# Patient Record
Sex: Male | Born: 1960 | Race: Asian | Hispanic: No | Marital: Single | Smoking: Never smoker
Health system: Southern US, Community
[De-identification: ages and names within clinical notes are randomized; demographics above are authoritative.]

## PROBLEM LIST (undated history)

## (undated) DIAGNOSIS — M109 Gout, unspecified: Secondary | ICD-10-CM

---

## 2011-08-14 ENCOUNTER — Ambulatory Visit (INDEPENDENT_AMBULATORY_CARE_PROVIDER_SITE_OTHER): Payer: BC Managed Care – PPO | Admitting: Family Medicine

## 2011-08-14 ENCOUNTER — Encounter: Payer: Self-pay | Admitting: Family Medicine

## 2011-08-14 VITALS — BP 132/86 | HR 57 | Temp 98.7°F | Ht 65.25 in | Wt 155.0 lb

## 2011-08-14 DIAGNOSIS — Z7189 Other specified counseling: Secondary | ICD-10-CM

## 2011-08-14 DIAGNOSIS — Z719 Counseling, unspecified: Secondary | ICD-10-CM

## 2011-08-14 DIAGNOSIS — H5310 Unspecified subjective visual disturbances: Secondary | ICD-10-CM

## 2011-08-14 LAB — CBC
MCH: 25.2 pg — ABNORMAL LOW (ref 26.0–34.0)
MCHC: 32.7 g/dL (ref 30.0–36.0)
MCV: 76.9 fL — ABNORMAL LOW (ref 78.0–100.0)
Platelets: 204 10*3/uL (ref 150–400)
RDW: 14.1 % (ref 11.5–15.5)
WBC: 8.3 10*3/uL (ref 4.0–10.5)

## 2011-08-14 LAB — LIPID PANEL
Cholesterol: 182 mg/dL (ref 0–200)
HDL: 33 mg/dL — ABNORMAL LOW (ref >39)
Total CHOL/HDL Ratio: 5.5 Ratio
Triglycerides: 98 mg/dL (ref <150)
VLDL: 20 mg/dL (ref 0–40)

## 2011-08-14 LAB — COMPREHENSIVE METABOLIC PANEL
ALT: 40 U/L (ref 0–53)
CO2: 29 mEq/L (ref 19–32)
Creat: 1 mg/dL (ref 0.50–1.35)
Total Bilirubin: 0.5 mg/dL (ref 0.3–1.2)

## 2011-08-14 NOTE — Progress Notes (Signed)
  Subjective:    Patient ID: Steve Sharp, male    DOB: 03-10-1961, 50 y.o.   MRN: [id number]  HPI Patient is here for a new patient exam/yearly physical:  Health maintenance: Patient refuses flu vaccine. Patient would like to defer colonoscopy at this time. States he will talk to his wife. Although patient does not have any past medical history or strong family history of medical problem he does request to have low work drawn today.  Left eye vision change: Patient states for the past 2-3 months he will have cloudy vision off and on. This occurs daily. If he rubs his eyes his vision clears. His vision was a cloudy until he rubs his left eye. His last eye and was 8-7 years ago. Patient states he can still see well. Does not use glasses.  See the appropriate areas of chart for past medical history, surgeries, social history and family history.   Review of Systems No problems with urination. No black stools. No bright red blood per rectum. No cold symptoms. No fever. No swelling in ankles. No weakness. No chest pain.    Objective:   Physical Exam  Constitutional: He is oriented to person, place, and time. He appears well-developed and well-nourished.  HENT:  Head: Normocephalic and atraumatic.  Right Ear: External ear normal.  Left Ear: External ear normal.  Mouth/Throat: Oropharynx is clear and moist.  Eyes: EOM are normal. Pupils are equal, round, and reactive to light. Right eye exhibits no discharge and no exudate. No foreign body present in the right eye. Left eye exhibits no discharge and no exudate. No foreign body present in the left eye. Right conjunctiva is not injected. No scleral icterus.       No pain in eye. No pain with movement of eye muscles. No redness of eye. Funduscopic exam within normal limits. Retinal reflex equal bilateral.  Neck: Normal range of motion. No thyromegaly present.  Cardiovascular: Normal rate, regular rhythm and normal heart sounds.   No murmur  heard. Pulmonary/Chest: Effort normal and breath sounds normal. No respiratory distress. He has no wheezes.  Abdominal: Soft. He exhibits no distension. There is no tenderness.  Musculoskeletal: Normal range of motion. He exhibits no edema.  Neurological: He is alert and oriented to person, place, and time.  Skin: No rash noted.  Psychiatric: He has a normal mood and affect. His behavior is normal.          Assessment & Plan:

## 2011-08-17 DIAGNOSIS — H5310 Unspecified subjective visual disturbances: Secondary | ICD-10-CM | POA: Insufficient documentation

## 2011-08-17 DIAGNOSIS — Z719 Counseling, unspecified: Secondary | ICD-10-CM | POA: Insufficient documentation

## 2011-08-17 NOTE — Assessment & Plan Note (Signed)
Vision 20/20 in right eye and 20/60 in left. Would recommend the patient have this examined as soon as possible by an optometrist or ophthalmologist. This has come on slowly over the past 3 months. No eye pain. No findings on physical exam. Retinal exam within normal limits. Do not feel this is an emergency, but do think is the to be examined further by a specialist.

## 2011-08-17 NOTE — Assessment & Plan Note (Signed)
This is patient's first appointment at the clinic. We'll draw a cmet, lipid panel and CBC. Patient states he does not want a flu shot. Patient also states he does not want a colonoscopy at this time. But he will talk with his wife.

## 2011-09-04 ENCOUNTER — Telehealth: Payer: Self-pay | Admitting: Family Medicine

## 2011-09-04 NOTE — Telephone Encounter (Signed)
Fwd. To Dr.Caviness .Joss Friedel  

## 2011-09-04 NOTE — Telephone Encounter (Signed)
Pt seen beginning of the month, had blood work done & never heard back about the results, is asking for a letter and copy of results to be sent to the mailing address.

## 2011-09-06 ENCOUNTER — Encounter: Payer: Self-pay | Admitting: Family Medicine

## 2011-09-06 NOTE — Telephone Encounter (Signed)
Letter sent to patient with results.

## 2011-09-08 ENCOUNTER — Telehealth: Payer: Self-pay | Admitting: Family Medicine

## 2011-09-08 NOTE — Telephone Encounter (Signed)
Pt had questions about her last labs. Explained to pt. Lorenda Hatchet, Renato Battles

## 2011-09-08 NOTE — Telephone Encounter (Signed)
Steve Sharp  Is calling with some questions about her last appointment.

## 2012-03-06 ENCOUNTER — Ambulatory Visit: Payer: BC Managed Care – PPO | Admitting: Family Medicine

## 2012-03-06 ENCOUNTER — Ambulatory Visit (INDEPENDENT_AMBULATORY_CARE_PROVIDER_SITE_OTHER): Payer: BC Managed Care – PPO | Admitting: Sports Medicine

## 2012-03-06 ENCOUNTER — Encounter: Payer: Self-pay | Admitting: Sports Medicine

## 2012-03-06 VITALS — BP 121/77 | HR 73 | Temp 99.0°F | Ht 65.25 in | Wt 153.0 lb

## 2012-03-06 DIAGNOSIS — M7750 Other enthesopathy of unspecified foot: Secondary | ICD-10-CM

## 2012-03-06 DIAGNOSIS — M7742 Metatarsalgia, left foot: Secondary | ICD-10-CM

## 2012-03-06 MED ORDER — IBUPROFEN 800 MG PO TABS: 800.0000 mg | ORAL_TABLET | Freq: Three times a day (TID) | ORAL | Status: AC | PRN

## 2012-03-06 NOTE — Patient Instructions (Addendum)
I would like you to get an X-ray of your foot to make sure you have not broken it - please go to the [facility] radiology department for your X-rays. Please wear the post-op shoe we provided you for the next 2 weeks until your follow up. If you have broken a bone I will call you with further follow up instructions. I have written an prescription for ibuprofen to be taken three times daily for the next 2 weeks to help with inflammation. Please use ice on the affected area 2-3 times per day until the swelling goes down or your pain is gone.   Follow up in 2-3 weeks with myself or your primary doctor.

## 2012-03-12 DIAGNOSIS — M7742 Metatarsalgia, left foot: Secondary | ICD-10-CM | POA: Insufficient documentation

## 2012-03-12 NOTE — Assessment & Plan Note (Signed)
New-onset of left-sided foot pain over the second and third metatarsal heads.  No notable sitting trauma.  No evidence of infection.  Tender to palpation over the second and third heads indicative of likely metatarsalgia.  Question stress fracture in this region given history of long walk on the beach. Patient provided postop shoe and directed to [facility] radiology department for imaging.  Patient voices understanding. Ibuprofen for pain and inflammation

## 2012-03-12 NOTE — Progress Notes (Signed)
Patient ID: Steve Sharp, male   DOB: 08-30-1961, 51 y.o.   MRN: [id number] HPI:  Steve Sharp is a 51 y.o. male presenting today for evaluation of L foot pain reports the pain began 2 days previously.  Does not recall any type of injury.  He does report that 3 days prior to presentation he was at the beach and did a long walk on the beach barefoot.  Denies any prior trauma or injury to this foot.  Denies any fevers chills.  Does report some local erythema and swelling no area of fluctuance pain with motion of the foot pain and with weightbearing    ROS Per history of present illness   HISTORY Medications Reviewed & Updated, see associated section Medical Hx Reviewed: Significant for visual disturbance, no history of diabetes or other signs of immune compromise.  No history of vascular disease  Social History Reviewed:  Significant for nonsmoker will   PE: GENERAL:   adult  male.  Examined in [facility].   In no discomfort; norespiratory distress.   PSYCH: Alert and appropriately interactive; Insight:Good   H&N: AT/[facility], MMM, no scleral icterus, EOMi EXTREMITIES: Moves all 4 extremities spontaneously, warm well perfused, no edema, bilateral DP and PT pulses 2/4.   >MSK/Osteopathic: Mild warmth and mild erythema and edema over the second and third MTP joint. no signs of fluctuance.  Tenderness To palpation over the head of the second and third MTP joint.  No pain with range of motion of the ankle.  Negative drawer test no tenderness to palpation over the talar dome.  No tenderness to palpation on the plantar surface of the second and third metatarsal heads tenderness. ROM of foot and ankle WNL

## 2016-06-29 ENCOUNTER — Emergency Department (HOSPITAL_COMMUNITY)
Admission: EM | Admit: 2016-06-29 | Discharge: 2016-06-29 | Disposition: A | Discharge status: Home / Self Care | Payer: BLUE CROSS/BLUE SHIELD | Attending: Emergency Medicine | Admitting: Emergency Medicine

## 2016-06-29 ENCOUNTER — Emergency Department (HOSPITAL_COMMUNITY): Payer: BLUE CROSS/BLUE SHIELD

## 2016-06-29 ENCOUNTER — Encounter (HOSPITAL_COMMUNITY): Payer: Self-pay | Admitting: Emergency Medicine

## 2016-06-29 DIAGNOSIS — R509 Fever, unspecified: Secondary | ICD-10-CM | POA: Diagnosis present

## 2016-06-29 DIAGNOSIS — J189 Pneumonia, unspecified organism: Secondary | ICD-10-CM | POA: Diagnosis not present

## 2016-06-29 DIAGNOSIS — M10072 Idiopathic gout, left ankle and foot: Secondary | ICD-10-CM | POA: Diagnosis not present

## 2016-06-29 HISTORY — DX: Gout, unspecified: M10.9

## 2016-06-29 LAB — COMPREHENSIVE METABOLIC PANEL
ALK PHOS: 79 U/L (ref 38–126)
ALT: 34 U/L (ref 17–63)
AST: 30 U/L (ref 15–41)
Albumin: 3.7 g/dL (ref 3.5–5.0)
Anion gap: 8 (ref 5–15)
BILIRUBIN TOTAL: 0.8 mg/dL (ref 0.3–1.2)
BUN: 13 mg/dL (ref 6–20)
CALCIUM: 9.3 mg/dL (ref 8.9–10.3)
CHLORIDE: 107 mmol/L (ref 101–111)
CO2: 27 mmol/L (ref 22–32)
CREATININE: 1.22 mg/dL (ref 0.61–1.24)
Glucose, Bld: 120 mg/dL — ABNORMAL HIGH (ref 65–99)
Potassium: 4.4 mmol/L (ref 3.5–5.1)
Sodium: 142 mmol/L (ref 135–145)
TOTAL PROTEIN: 7.9 g/dL (ref 6.5–8.1)

## 2016-06-29 LAB — URINALYSIS, ROUTINE W REFLEX MICROSCOPIC
BILIRUBIN URINE: NEGATIVE
Glucose, UA: NEGATIVE mg/dL
HGB URINE DIPSTICK: NEGATIVE
KETONES UR: NEGATIVE mg/dL
Leukocytes, UA: NEGATIVE
NITRITE: NEGATIVE
Protein, ur: NEGATIVE mg/dL
SPECIFIC GRAVITY, URINE: 1.021 (ref 1.005–1.030)
pH: 5 (ref 5.0–8.0)

## 2016-06-29 LAB — I-STAT CG4 LACTIC ACID, ED
LACTIC ACID, VENOUS: 2.82 mmol/L — AB (ref 0.5–1.9)
LACTIC ACID, VENOUS: 1.19 mmol/L (ref 0.5–1.9)

## 2016-06-29 LAB — CBC WITH DIFFERENTIAL/PLATELET
BASOS PCT: 0 %
Basophils Absolute: 0 10*3/uL (ref 0.0–0.1)
EOS PCT: 1 %
Eosinophils Absolute: 0.2 10*3/uL (ref 0.0–0.7)
HCT: 45.6 % (ref 39.0–52.0)
Hemoglobin: 14.8 g/dL (ref 13.0–17.0)
Lymphocytes Relative: 17 %
Lymphs Abs: 2.4 10*3/uL (ref 0.7–4.0)
MCH: 25.6 pg — ABNORMAL LOW (ref 26.0–34.0)
MCHC: 32.5 g/dL (ref 30.0–36.0)
MCV: 78.8 fL (ref 78.0–100.0)
MONO ABS: 0.8 10*3/uL (ref 0.1–1.0)
MONOS PCT: 6 %
NEUTROS ABS: 10.7 10*3/uL — AB (ref 1.7–7.7)
Neutrophils Relative %: 76 %
Platelets: 263 10*3/uL (ref 150–400)
RBC: 5.79 MIL/uL (ref 4.22–5.81)
RDW: 13.7 % (ref 11.5–15.5)
WBC: 14 10*3/uL — ABNORMAL HIGH (ref 4.0–10.5)

## 2016-06-29 MED ORDER — AZITHROMYCIN 250 MG PO TABS
500.0000 mg | ORAL_TABLET | Freq: Once | ORAL | Status: AC
  Administered 2016-06-29: 500 mg via ORAL
  Filled 2016-06-29: qty 2

## 2016-06-29 MED ORDER — AZITHROMYCIN 250 MG PO TABS: 250.0000 mg | ORAL_TABLET | Freq: Every day | ORAL | 0 refills | Status: DC

## 2016-06-29 MED ORDER — DEXTROSE 5 % IV SOLN
1.0000 g | Freq: Once | INTRAVENOUS | Status: AC
  Administered 2016-06-29: 1 g via INTRAVENOUS
  Filled 2016-06-29: qty 10

## 2016-06-29 NOTE — ED Triage Notes (Signed)
Pt states started running a fever this morning-- has gout in left foot-- states "had a hard time breathing" --  Took Tylenol 2 tabs 0630 prior to coming to the hospital.

## 2016-06-29 NOTE — Discharge Planning (Addendum)
Jupiter Kabir J. Lucretia RoersWood, RN, BSN, [place] [phone] Rolla Flattenamellia J. Lucretia RoersWood, RN, BSN, [place] [phone] [facility] @ 0930.  Spoke with pt at bedside and provided brochure with directions and phone number highlighted.  Pt verbalizes understanding of keeping appointment.

## 2016-06-29 NOTE — ED Provider Notes (Signed)
[facility]-EMERGENCY DEPT Provider Note   CSN: [id number] Arrival date & time: 06/29/16  4010     History   Chief Complaint Chief Complaint  Patient presents with  . Fever    HPI Steve Sharp is a 55 y.o. male.Complains of right-sided chest pain onset 2 days ago. Associated symptoms include subjective fever. No shortness of breath. Denies cough. No other associated symptoms and nothing makes symptoms better or worse. No nausea or vomiting. Feels improved since treatment with Tylenol earlier today.  HPI  Past Medical History:  Diagnosis Date  . Gout     Patient Active Problem List   Diagnosis Date Noted  . 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 Health counseling 08/17/2011  . Subjective vision disturbance, left eye 08/17/2011    History reviewed. No pertinent surgical history.     Home Medications    Prior to Admission medications   Medication Sig Start Date End Date Taking? Authorizing Provider  acetaminophen (TYLENOL) 325 MG tablet Take 650 mg by mouth every 6 (six) hours as needed for moderate pain or headache.   Yes Historical Provider, MD  ibuprofen (ADVIL,MOTRIN) 200 MG tablet Take 200 mg by mouth every 6 (six) hours as needed for headache or moderate pain.   Yes Historical Provider, MD  naproxen sodium (ANAPROX) 220 MG tablet Take 440 mg by mouth as needed (for pain).   Yes Historical Provider, MD    Family History No family history on file.  Social History Social History  Substance Use Topics  . Smoking status: Never Smoker  . Smokeless tobacco: Never Used  . Alcohol use No     Allergies   Review of patient's allergies indicates no known allergies.   Review of Systems Review of Systems  Constitutional: Positive for fever.  HENT: Negative.   Respiratory: Negative.   Cardiovascular: Positive for chest pain.  Gastrointestinal: Negative.   Musculoskeletal: Positive for arthralgias.       Mild pain in left foot  Skin: Negative.   Neurological:  Negative.   Psychiatric/Behavioral: Negative.   All other systems reviewed and are negative.    Physical Exam Updated Vital Signs BP 106/88 (BP Location: Right Arm)   Pulse 83   Temp 99.2 F (37.3 C) (Oral)   Resp 20   Ht 5\' 5"  (1.651 m)   Wt 149 lb (67.6 kg)   SpO2 96%   BMI 24.79 kg/m   Physical Exam  Constitutional: He appears well-developed and well-nourished.  HENT:  Head: Normocephalic and atraumatic.  Eyes: Conjunctivae are normal. Pupils are equal, round, and reactive to light.  Neck: Neck supple. No tracheal deviation present. No thyromegaly present.  Cardiovascular: Normal rate and regular rhythm.   No murmur heard. Pulmonary/Chest: Effort normal and breath sounds normal.  Abdominal: Soft. Bowel sounds are normal. He exhibits no distension. There is no tenderness.  Musculoskeletal: Normal range of motion. He exhibits no edema or tenderness.  Neurological: He is alert. Coordination normal.  Skin: Skin is warm and dry. No rash noted.  Psychiatric: He has a normal mood and affect.  Nursing note and vitals reviewed.    ED Treatments / Results  Labs (all labs ordered are listed, but only abnormal results are displayed) Labs Reviewed  COMPREHENSIVE METABOLIC PANEL - Abnormal; Notable for the following:       Result Value   Glucose, Bld 120 (*)    All other components within normal limits  URINALYSIS, ROUTINE W REFLEX MICROSCOPIC (NOT AT [facility]) - Abnormal;  Notable for the following:    APPearance CLOUDY (*)    All other components within normal limits  CBC WITH DIFFERENTIAL/PLATELET - Abnormal; Notable for the following:    WBC 14.0 (*)    MCH 25.6 (*)    Neutro Abs 10.7 (*)    All other components within normal limits  I-STAT CG4 LACTIC ACID, ED - Abnormal; Notable for the following:    Lactic Acid, Venous 2.82 (*)    All other components within normal limits  CULTURE, BLOOD (ROUTINE X 2)  CULTURE, BLOOD (ROUTINE X 2)  URINE CULTURE  I-STAT CG4 LACTIC  ACID, ED   Results for orders placed or performed during the hospital encounter of 06/29/16  Comprehensive metabolic panel  Result Value Ref Range   Sodium 142 135 - 145 mmol/L   Potassium 4.4 3.5 - 5.1 mmol/L   Chloride 107 101 - 111 mmol/L   CO2 27 22 - 32 mmol/L   Glucose, Bld 120 (H) 65 - 99 mg/dL   BUN 13 6 - 20 mg/dL   Creatinine, Ser 1.61 0.61 - 1.24 mg/dL   Calcium 9.3 8.9 - 09.6 mg/dL   Total Protein 7.9 6.5 - 8.1 g/dL   Albumin 3.7 3.5 - 5.0 g/dL   AST 30 15 - 41 U/L   ALT 34 17 - 63 U/L   Alkaline Phosphatase 79 38 - 126 U/L   Total Bilirubin 0.8 0.3 - 1.2 mg/dL   GFR calc non Af Amer >60 >60 mL/min   GFR calc Af Amer >60 >60 mL/min   Anion gap 8 5 - 15  Urinalysis, Routine w reflex microscopic  Result Value Ref Range   Color, Urine YELLOW YELLOW   APPearance CLOUDY (A) CLEAR   Specific Gravity, Urine 1.021 1.005 - 1.030   pH 5.0 5.0 - 8.0   Glucose, UA NEGATIVE NEGATIVE mg/dL   Hgb urine dipstick NEGATIVE NEGATIVE   Bilirubin Urine NEGATIVE NEGATIVE   Ketones, ur NEGATIVE NEGATIVE mg/dL   Protein, ur NEGATIVE NEGATIVE mg/dL   Nitrite NEGATIVE NEGATIVE   Leukocytes, UA NEGATIVE NEGATIVE  CBC with Differential  Result Value Ref Range   WBC 14.0 (H) 4.0 - 10.5 K/uL   RBC 5.79 4.22 - 5.81 MIL/uL   Hemoglobin 14.8 13.0 - 17.0 g/dL   HCT 04.5 40.9 - 81.1 %   MCV 78.8 78.0 - 100.0 fL   MCH 25.6 (L) 26.0 - 34.0 pg   MCHC 32.5 30.0 - 36.0 g/dL   RDW 91.4 78.2 - 95.6 %   Platelets 263 150 - 400 K/uL   Neutrophils Relative % 76 %   Neutro Abs 10.7 (H) 1.7 - 7.7 K/uL   Lymphocytes Relative 17 %   Lymphs Abs 2.4 0.7 - 4.0 K/uL   Monocytes Relative 6 %   Monocytes Absolute 0.8 0.1 - 1.0 K/uL   Eosinophils Relative 1 %   Eosinophils Absolute 0.2 0.0 - 0.7 K/uL   Basophils Relative 0 %   Basophils Absolute 0.0 0.0 - 0.1 K/uL  I-Stat CG4 Lactic Acid, ED  Result Value Ref Range   Lactic Acid, Venous 2.82 (HH) 0.5 - 1.9 mmol/L   Comment NOTIFIED PHYSICIAN   I-Stat  CG4 Lactic Acid, ED  Result Value Ref Range   Lactic Acid, Venous 1.19 0.5 - 1.9 mmol/L   Dg Chest 2 View  Result Date: 06/29/2016 CLINICAL DATA:  Fever. EXAM: CHEST  2 VIEW COMPARISON:  None. FINDINGS: Right basilar opacity is streaky with mild  volume loss but in the setting of fever is presumably pneumonia. Normal heart size and mediastinal contours. No acute osseous finding. IMPRESSION: Right basilar opacity consistent with bronchopneumonia in this setting. Followup PA and lateral chest X-ray is recommended in 3-4 weeks following trial of antibiotic therapy to ensure resolution. Electronically Signed   By: Marnee SpringJonathon  Watts M.D.   On: 06/29/2016 08:11    EKG  EKG Interpretation None       Radiology Dg Chest 2 View  Result Date: 06/29/2016 CLINICAL DATA:  Fever. EXAM: CHEST  2 VIEW COMPARISON:  None. FINDINGS: Right basilar opacity is streaky with mild volume loss but in the setting of fever is presumably pneumonia. Normal heart size and mediastinal contours. No acute osseous finding. IMPRESSION: Right basilar opacity consistent with bronchopneumonia in this setting. Followup PA and lateral chest X-ray is recommended in 3-4 weeks following trial of antibiotic therapy to ensure resolution. Electronically Signed   By: Marnee SpringJonathon  Watts M.D.   On: 06/29/2016 08:11    Procedures Procedures (including critical care time)  Medications Ordered in ED Medications  cefTRIAXone (ROCEPHIN) 1 g in dextrose 5 % 50 mL IVPB (not administered)  azithromycin (ZITHROMAX) tablet 500 mg (not administered)     Initial Impression / Assessment and Plan / ED Course  I have reviewed the triage vital signs and the nursing notes.  Pertinent labs & imaging results that were available during my care of the patient were reviewed by me and considered in my medical decision making (see chart for details).  Clinical Course    Patient resting comfortably after treatment with intravenous ceftriaxone and oral  azithromycin. [facility]/05/2016. Plan prescription azithromycin ibuprofen as needed for pain  Final Clinical Impressions(s) / ED Diagnoses  Diagnosis #1 community acquired pneumonia #2 gouty arthropathy Final diagnoses:  None    New Prescriptions New Prescriptions   No medications on file     Doug SouSam Novia Lansberry, MD 06/29/16 1751

## 2016-06-29 NOTE — Discharge Instructions (Signed)
Ibuprofen as directed for foot pain. Keep your scheduled appointment on 07/05/2016. Return if concern for any reason

## 2016-06-30 LAB — URINE CULTURE: Culture: <10000 — AB

## 2016-07-04 LAB — CULTURE, BLOOD (ROUTINE X 2)
CULTURE: NO GROWTH
Culture: NO GROWTH

## 2016-07-05 ENCOUNTER — Other Ambulatory Visit: Payer: Self-pay | Admitting: Critical Care Medicine

## 2016-07-05 ENCOUNTER — Encounter: Payer: Self-pay | Admitting: Critical Care Medicine

## 2016-07-05 ENCOUNTER — Ambulatory Visit (HOSPITAL_COMMUNITY)
Admission: RE | Admit: 2016-07-05 | Discharge: 2016-07-05 | Disposition: A | Discharge status: Home / Self Care | Payer: BLUE CROSS/BLUE SHIELD | Source: Ambulatory Visit | Attending: Critical Care Medicine | Admitting: Critical Care Medicine

## 2016-07-05 ENCOUNTER — Ambulatory Visit: Payer: BLUE CROSS/BLUE SHIELD | Attending: Critical Care Medicine | Admitting: Critical Care Medicine

## 2016-07-05 VITALS — BP 121/82 | HR 83 | Temp 98.3°F | Resp 18 | Ht 65.0 in | Wt 149.4 lb

## 2016-07-05 DIAGNOSIS — M1A0710 Idiopathic chronic gout, right ankle and foot, without tophus (tophi): Secondary | ICD-10-CM | POA: Diagnosis not present

## 2016-07-05 DIAGNOSIS — K056 Periodontal disease, unspecified: Secondary | ICD-10-CM | POA: Diagnosis not present

## 2016-07-05 DIAGNOSIS — J90 Pleural effusion, not elsewhere classified: Secondary | ICD-10-CM

## 2016-07-05 DIAGNOSIS — J189 Pneumonia, unspecified organism: Secondary | ICD-10-CM

## 2016-07-05 DIAGNOSIS — J948 Other specified pleural conditions: Secondary | ICD-10-CM

## 2016-07-05 DIAGNOSIS — M109 Gout, unspecified: Secondary | ICD-10-CM | POA: Insufficient documentation

## 2016-07-05 MED ORDER — AZITHROMYCIN 250 MG PO TABS: 500.0000 mg | ORAL_TABLET | Freq: Every day | ORAL | 0 refills | Status: AC

## 2016-07-05 NOTE — Progress Notes (Signed)
Subjective:    Patient ID: Steve Sharp, male    DOB: September 01, 1961, 55 y.o.   MRN: [id number]  55 y.o Asian Male seen in ED 9/21 for CAP with RLL bronchopneumonia.  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 There is no frequent throat clearing, hemoptysis, hoarse voice, sputum production or wheezing. Primary symptoms comments: Remains weak Cough is weak, cough is dry. This is a new problem. The current episode started 1 to 4 weeks ago. Episode frequency: dyspnea, if lay down right side will hurt,  The cough is non-productive. Associated symptoms include chest pain, dyspnea on exertion, heartburn, malaise/fatigue, postnasal drip, rhinorrhea, sneezing and a sore throat. Pertinent negatives include no appetite change, ear pain, fever, myalgias, nasal congestion, orthopnea, PND, sweats, trouble swallowing or weight loss. Associated symptoms comments: Chest pain is sharp, last only brief period of time occ chills. His symptoms are aggravated by any activity and climbing stairs. Relieved by: abx, is better compared to before. He reports moderate improvement on treatment. There is no history of asthma or pneumonia.   Past Medical History:  Diagnosis Date  . Gout   . Gout      History reviewed. No pertinent family history.   Social History   Social History  . Marital status: Single    Spouse name: N/A  . Number of children: N/A  . Years of education: N/A   Occupational History  . Not on file.   Social History Main Topics  . Smoking status: Never Smoker  . Smokeless tobacco: Never Used  . Alcohol use No  . Drug use: No  . Sexual activity: Yes   Other Topics Concern  . Not on file   Social History Narrative   Patient works at the [employer] with his wife. He does acrylic nails. He  has lived in the [place] for 25 years. He is originally from [place]. He speaks [place] and prefers to have a phone interpreter. His wife is a patient of Dr. Edmonia James and refered patient to the clinic.     No Known Allergies   Outpatient Medications Prior to Visit  Medication Sig Dispense Refill  . acetaminophen (TYLENOL) 325 MG tablet Take 650 mg by mouth every 6 (six) hours as needed for moderate pain or headache.    . ibuprofen (ADVIL,MOTRIN) 200 MG tablet Take 200 mg by mouth every 6 (six) hours as needed for headache or moderate pain.    . naproxen sodium (ANAPROX) 220 MG tablet Take 440 mg by mouth as needed (for pain).    Marland Kitchen azithromycin (ZITHROMAX) 250 MG tablet Take 1 tablet (250 mg total) by mouth daily. Take 1 tablet daily starting 06/30/2016 (Patient not taking: Reported on 07/05/2016) 4 tablet 0   No facility-administered medications prior to visit.       Review of Systems  Constitutional: Positive for chills, fatigue and malaise/fatigue. Negative for appetite change, fever and weight loss.  HENT: Positive for postnasal drip, rhinorrhea, sneezing and sore throat. Negative for ear discharge, ear pain, hoarse voice, sinus pressure and trouble swallowing.   Respiratory: Positive for cough, chest tightness and shortness of breath. Negative for hemoptysis, sputum production, choking, wheezing and stridor.   Cardiovascular: Positive for chest pain and dyspnea on exertion. Negative for PND.  Gastrointestinal: Positive  for heartburn.       Gerd  Musculoskeletal: Negative for myalgias.       Objective:   Physical Exam  Vitals:   07/05/16 0925  BP: 121/82  Pulse: 83  Resp: 18  Temp: 98.3 F (36.8 C)  TempSrc: Oral  SpO2: 97%  Weight: 149 lb 5.8 oz (67.7 kg)  Height: 5\' 5"  (1.651 m)    Gen: Pleasant, well-nourished, in no distress,  normal affect  ENT: No lesions,  mouth clear,  oropharynx clear, no postnasal drip  Neck: No JVD, no TMG, no carotid bruits  Lungs: No use  of accessory muscles, dull to percussion RLL1/2 way up , with decreased bs c/w effusion RLL.    Cardiovascular: RRR, heart sounds normal, no murmur or gallops, no peripheral edema  Abdomen: soft and NT, no HSM,  BS normal  Musculoskeletal: No deformities, no cyanosis or clubbing  Neuro: alert, non focal  Skin: Warm, no lesions or rashes  All labs from ed visit 9/21 reviewed  CXR 9/21:  IMPRESSION: Right basilar opacity consistent with bronchopneumonia in this setting. Followup PA and lateral chest X-ray is recommended in 3-4 weeks following trial of antibiotic therapy to ensure resolution. Electronically Signed   By: Marnee SpringJonathon  Watts M.D.   On: 06/29/2016 08:11       Assessment & Plan:  I personally reviewed all images and lab data [facility] as well as any outside material available during this office visit and agree with the  radiology impressions.   CAP (community acquired pneumonia) RLL Cap with prob pleural effusion developing on the R Pleuritic chest pain Severe periodontal dz Plan Azithromycin 500mg  daily x 3 more days R and L lat decub cxr to determine if effusion needs drainage Dental referral  Pleural effusion on right Pleural effusion on R Need to see if needs to be drained  Periodontal disease Dental disease severe Referral dental services   Steve Sharp was seen today for pneumonia.  Diagnoses and all orders for this visit:  CAP (community acquired pneumonia) -     DG Chest 2 View; Future -     DG Chest Bilateral Decubitus; Future  Periodontal disease -     Ambulatory referral to Dentistry  Idiopathic chronic gout of right foot without tophus  Pleural effusion on right -     DG Chest Bilateral Decubitus; Future  Other orders -     azithromycin (ZITHROMAX) 250 MG tablet; Take 2 tablets (500 mg total) by mouth daily.

## 2016-07-05 NOTE — Assessment & Plan Note (Signed)
RLL Cap with prob pleural effusion developing on the R Pleuritic chest pain Severe periodontal dz Plan Azithromycin 500mg  daily x 3 more days R and L lat decub cxr to determine if effusion needs drainage Dental referral

## 2016-07-05 NOTE — Progress Notes (Signed)
Patient is here to FU on pneumonia.  Patient denies pain at this time.  Patient complains of right side rib pain when laying down. Patient complains of intermittent chest pain when sneezing.  Patient has finished the z-pack last night. Patient has not taken medication today. Patient has eaten today.

## 2016-07-05 NOTE — Assessment & Plan Note (Signed)
Pleural effusion on R Need to see if needs to be drained

## 2016-07-05 NOTE — Progress Notes (Signed)
CXR shows R sided loculated pleural effusion  Will order u/s guided thora per radiology  Shan LevansPatrick Rawley Harju

## 2016-07-05 NOTE — Assessment & Plan Note (Signed)
Dental disease severe Referral dental services

## 2016-07-05 NOTE — Patient Instructions (Addendum)
Obtain a chest xray today, I will call results Resume azithromycin 500mg  ( two tablets) daily for 3 more days, sent to the pharmacy We may need to send you back to radiology later for a drainage of fluid off the right lung A dental referral will be made , you may need teeth removed Return in follow up in any case on  October 4

## 2016-07-07 ENCOUNTER — Telehealth: Payer: Self-pay | Admitting: Family Medicine

## 2016-07-07 NOTE — Telephone Encounter (Signed)
Pt. Called requesting a refill on azithromycin (ZITHROMAX) 250 MG tablet  Pt. States it is only for three days and would like to know if he needs a refill. Please f/u

## 2016-07-10 NOTE — Telephone Encounter (Signed)
Patient verified DOB Patient states he completed the three extra days of azythromycin and patient reports feeling better. Based on Chest xray patient needs additional procedure completed per Dr. Delford FieldWright. Patient states he feels better and wonders in procedure is necessary.  MA informed patient of concern being routed to Dr. Delford FieldWright for his medical opinion. No further questions at this time.

## 2016-07-11 NOTE — Telephone Encounter (Signed)
You are welcome

## 2016-07-11 NOTE — Telephone Encounter (Signed)
I spoke to patient, he is better so let us hold off on thoracentesis and see him tomorrow as scheduled.

## 2016-07-11 NOTE — Telephone Encounter (Signed)
Thank you for this response.

## 2016-07-12 ENCOUNTER — Encounter: Payer: Self-pay | Admitting: Critical Care Medicine

## 2016-07-12 ENCOUNTER — Ambulatory Visit: Payer: BLUE CROSS/BLUE SHIELD | Attending: Critical Care Medicine | Admitting: Critical Care Medicine

## 2016-07-12 VITALS — BP 108/73 | HR 84 | Temp 98.2°F | Resp 18 | Ht 65.0 in | Wt 145.6 lb

## 2016-07-12 DIAGNOSIS — J90 Pleural effusion, not elsewhere classified: Secondary | ICD-10-CM | POA: Diagnosis present

## 2016-07-12 DIAGNOSIS — M109 Gout, unspecified: Secondary | ICD-10-CM | POA: Insufficient documentation

## 2016-07-12 DIAGNOSIS — J181 Lobar pneumonia, unspecified organism: Secondary | ICD-10-CM | POA: Diagnosis not present

## 2016-07-12 DIAGNOSIS — Z8701 Personal history of pneumonia (recurrent): Secondary | ICD-10-CM | POA: Insufficient documentation

## 2016-07-12 DIAGNOSIS — J189 Pneumonia, unspecified organism: Secondary | ICD-10-CM

## 2016-07-12 NOTE — Progress Notes (Signed)
Patient is here for Pneumonia FU  Patient denies pain at this time.  Patient has taken mucus medication and tylenol this morning. Patient has eaten today.  Patient declined flu vaccine today.  Patient complains of cough presenting on last Sunday and night sweats at 3/4 in the morning.

## 2016-07-12 NOTE — Progress Notes (Signed)
Subjective:    Patient ID: Steve Sharp, male    DOB: May 18, 1961, 55 y.o.   MRN: [id number]  55 y.o Asian Male seen in ED 9/21 for CAP with RLL bronchopneumonia.  Seen and released on po ABX azithromycin after one dose rocephin, now off azithromycin,  Works in a [employer], public exposure, no other sick contacts.    Pt seen in f/u after finishing extension of azithromycin Rx.  Overall pt is improved and back to work in nail shop. Pt wants to decline offer to drain pleural effusion on R seen at last visit d/t improvement in status.        Pneumonia  He complains of cough. There is no chest tightness, difficulty breathing, frequent throat clearing, hemoptysis, hoarse voice, shortness of breath, sputum production or wheezing. Primary symptoms comments: Remains weak Cough is weak, cough is dry. This is a new problem. The current episode started 1 to 4 weeks ago. Episode frequency: dyspnea, if lay down right side will hurt,  The problem has been rapidly improving. The cough is non-productive. Associated symptoms include heartburn, postnasal drip, rhinorrhea, sneezing and sweats. Pertinent negatives include no appetite change, chest pain, dyspnea on exertion, ear pain, fever, malaise/fatigue, myalgias, nasal congestion, orthopnea, PND, sore throat, trouble swallowing or weight loss. Associated symptoms comments: Chest pain is sharp, last only brief period of time occ chills. Relieved by: abx, is better compared to before. He reports significant improvement on treatment. There is no history of asthma or pneumonia.   Past Medical History:  Diagnosis Date  . Gout   . Gout      History reviewed. No pertinent family history.   Social History   Social History  . Marital status: Single    Spouse name: N/A  . Number of children: N/A  . Years of education: N/A   Occupational History  . Not on file.   Social History Main Topics  . Smoking status: Never Smoker  . Smokeless tobacco: Never Used    . Alcohol use No  . Drug use: No  . Sexual activity: Yes   Other Topics Concern  . Not on file   Social History Narrative   Patient works at the [employer] with his wife. He does acrylic nails. He has lived in the [place] for 25 years. He is originally from [place]. He speaks [place] and prefers to have a phone interpreter. His wife is a patient of Dr. Edmonia Sharp and refered patient to the clinic.     No Known Allergies   Outpatient Medications Prior to Visit  Medication Sig Dispense Refill  . acetaminophen (TYLENOL) 325 MG tablet Take 650 mg by mouth every 6 (six) hours as needed for moderate pain or headache.    . ibuprofen (ADVIL,MOTRIN) 200 MG tablet Take 200 mg by mouth every 6 (six) hours as needed for headache or moderate pain.    . naproxen sodium (ANAPROX) 220 MG tablet Take 440 mg by mouth as needed (for pain).     No facility-administered medications prior to visit.       Review of Systems  Constitutional: Positive for chills and fatigue. Negative for appetite change, fever, malaise/fatigue and weight loss.  HENT: Positive for postnasal drip, rhinorrhea and sneezing. Negative for ear discharge, ear pain, hoarse voice, sinus pressure, sore throat and trouble swallowing.   Respiratory: Positive for cough and chest tightness. Negative for hemoptysis, sputum production, choking, shortness of breath, wheezing and stridor.   Cardiovascular: Negative for chest  pain, dyspnea on exertion and PND.  Gastrointestinal: Positive for heartburn.       Gerd  Musculoskeletal: Negative for myalgias.       Objective:   Physical Exam   Vitals:   07/12/16 0924  BP: 108/73  Pulse: 84  Resp: 18  Temp: 98.2 F (36.8 C)  TempSrc: Oral  SpO2: 97%  Weight: 145 lb 9.6 oz (66 kg)  Height: 5\' 5"  (1.651 m)    Gen: Pleasant, well-nourished, in no distress,  normal affect  ENT: No lesions,  mouth clear,  oropharynx clear, no postnasal drip  Neck: No JVD, no TMG, no carotid  bruits  Lungs: No use of accessory muscles, dull to percussion RLL1/4 way up , with decreased bs c/w effusion RLL.  But improved from last ov  Cardiovascular: RRR, heart sounds normal, no murmur or gallops, no peripheral edema  Abdomen: soft and NT, no HSM,  BS normal  Musculoskeletal: No deformities, no cyanosis or clubbing  Neuro: alert, non focal  Skin: Warm, no lesions or rashes  All labs from ed visit 9/21 reviewed  CXR 9/21:  IMPRESSION: Right basilar opacity consistent with bronchopneumonia in this setting. Followup PA and lateral chest X-ray is recommended in 3-4 weeks following trial of antibiotic therapy to ensure resolution. Electronically Signed   By: Steve SpringJonathon  Sharp M.D.   On: 06/29/2016 08:11       Assessment & Plan:  I personally reviewed all images and lab data [facility] as well as any outside material available during this office visit and agree with the  radiology impressions.   Pleural effusion on right R effusion improved on exam Plan Hold off on thoracentesis  CAP (community acquired pneumonia) Resolved PNA on extension of ABX Plan No furthe abx F/u 6 weeks    Steve Sharp was seen today for pneumonia.  Diagnoses and all orders for this visit:  Pleural effusion  Community acquired pneumonia of right lower lobe of lung (HCC)  Pleural effusion on right

## 2016-07-12 NOTE — Patient Instructions (Signed)
Return 6 weeks Call if symptoms worsen No drainage needed for now No more antibiotics needed

## 2016-07-13 NOTE — Assessment & Plan Note (Signed)
Resolved PNA on extension of ABX Plan No furthe abx F/u 6 weeks

## 2016-07-13 NOTE — Assessment & Plan Note (Signed)
R effusion improved on exam Plan Hold off on thoracentesis

## 2016-12-08 ENCOUNTER — Telehealth: Payer: Self-pay | Admitting: Family Medicine

## 2016-12-08 NOTE — Telephone Encounter (Signed)
No answer, LMOVM. Overdue HM

## 2018-02-14 IMAGING — DX DG CHEST 2V
2 series · 2 of 2 positions shown · non-contrast
Comparison: None.

CLINICAL DATA: Fever.

EXAM:
CHEST  2 VIEW

[w chest pa]
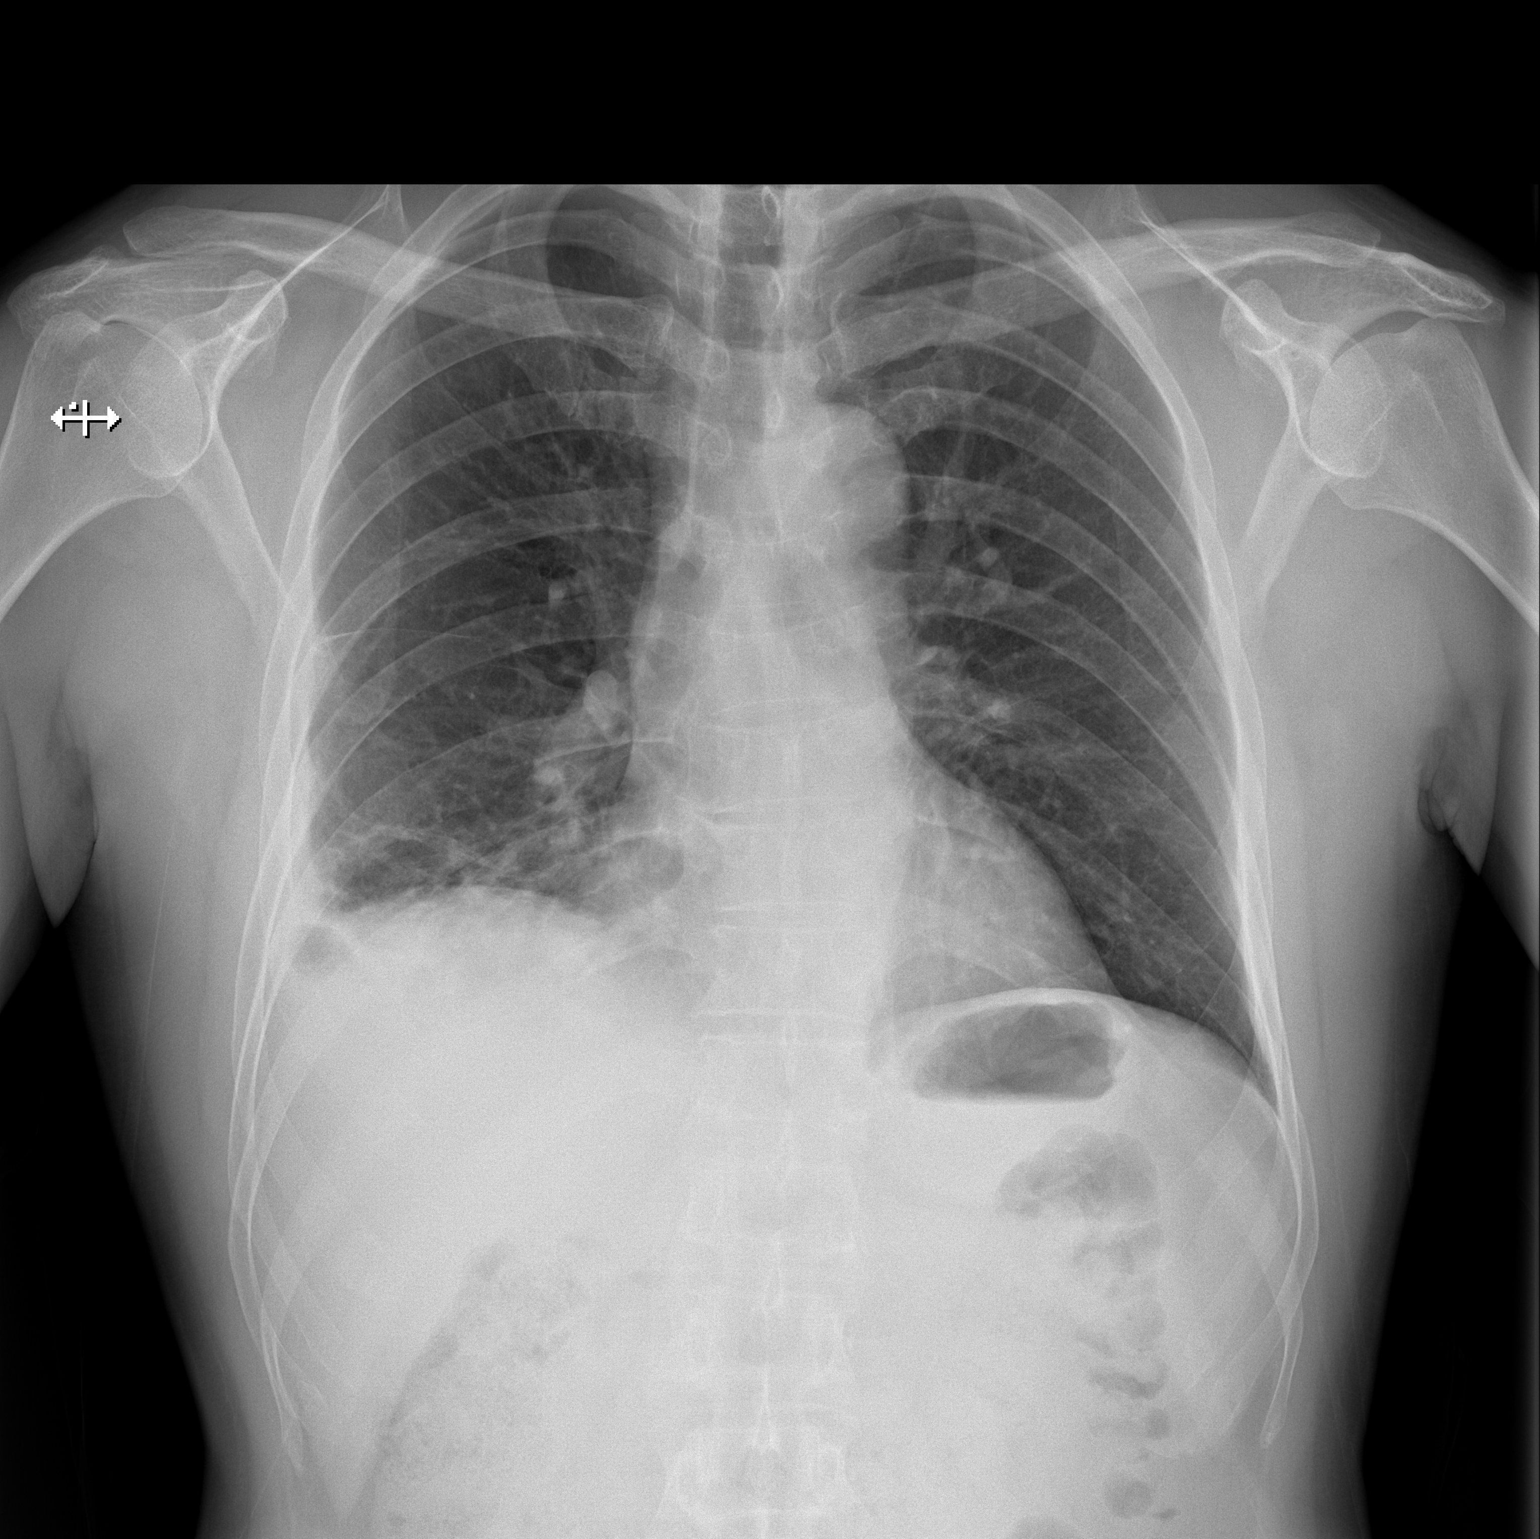

[w chest lat]
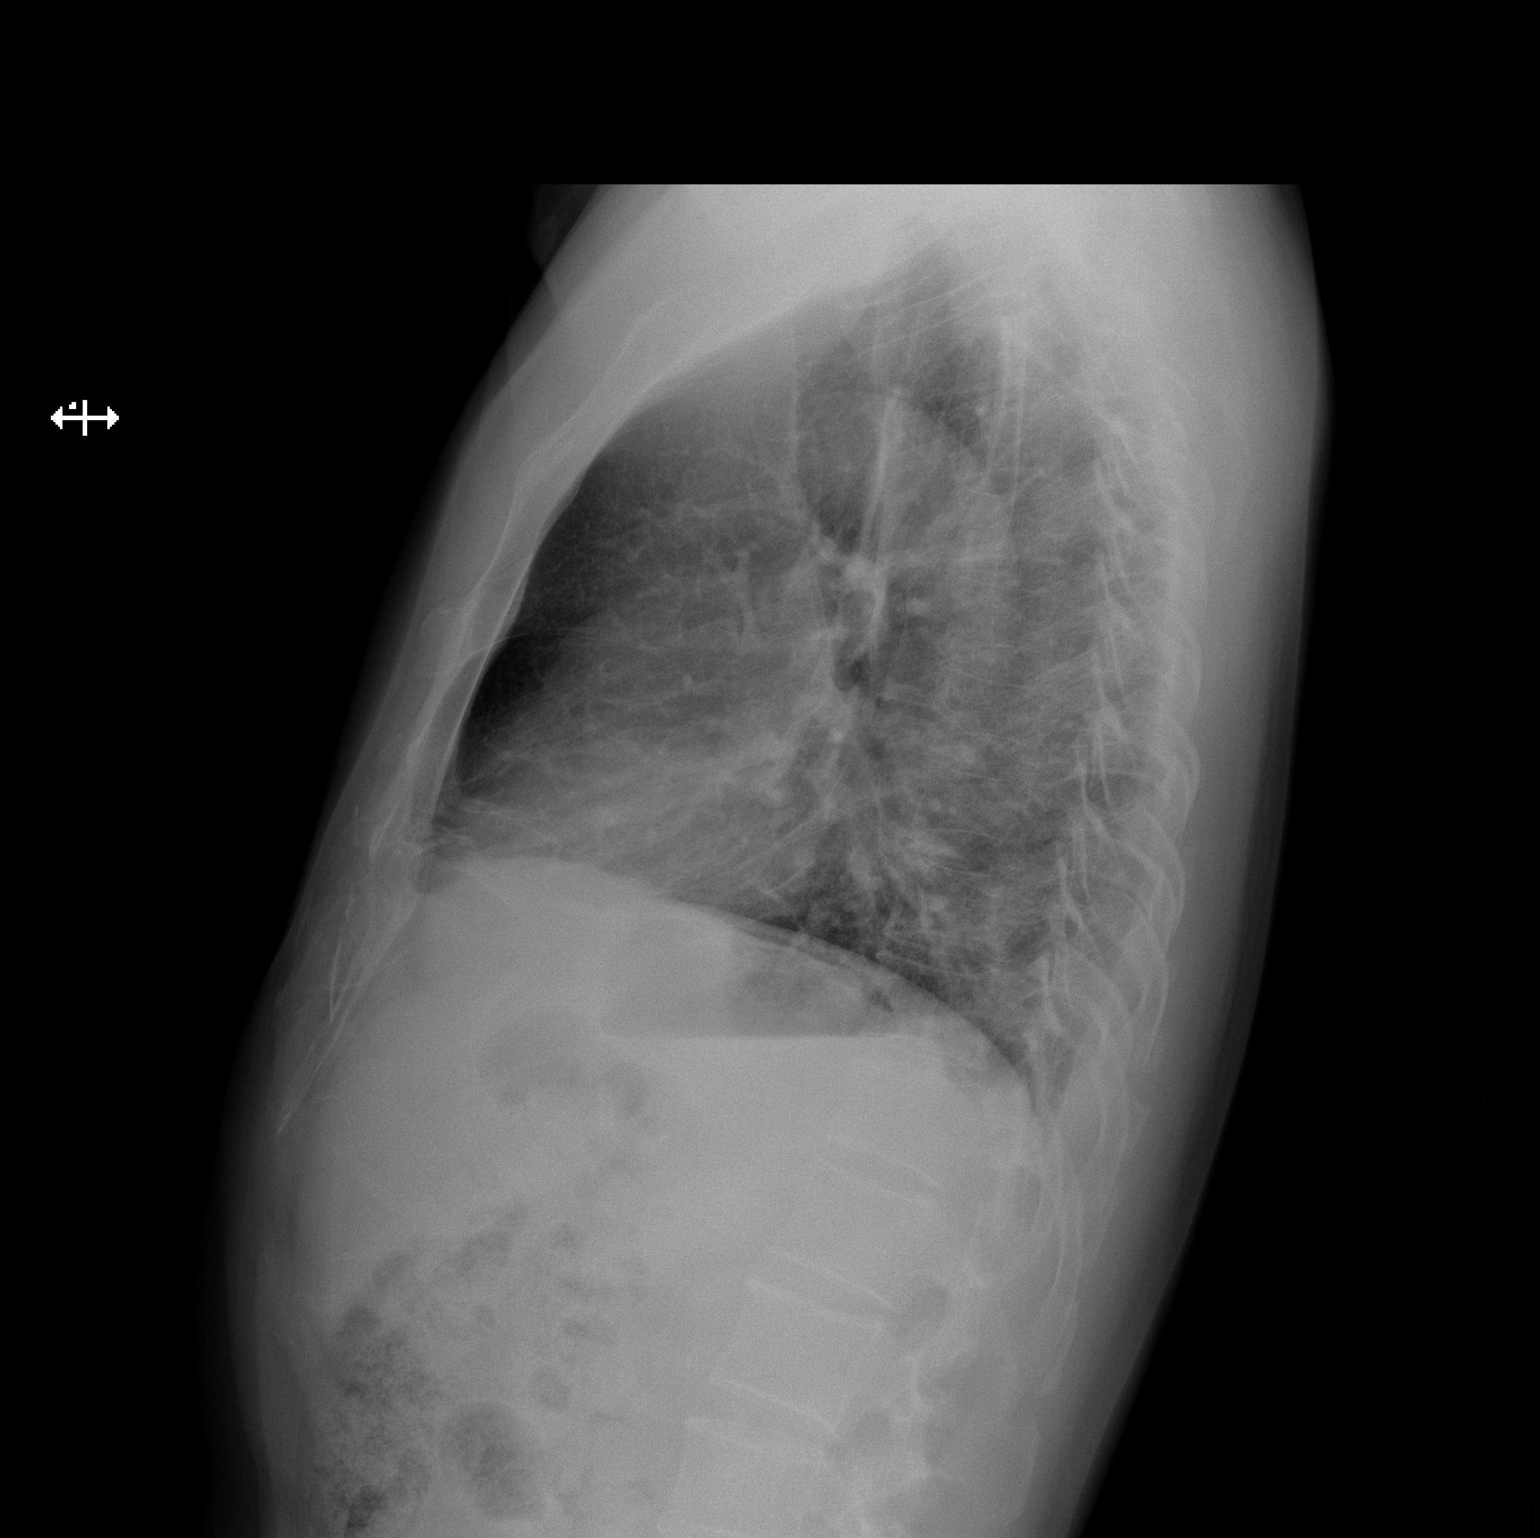

[2 of 2 positions shown; findings below may reference images not displayed]

FINDINGS: Right basilar opacity is streaky with mild volume loss but in the
setting of fever is presumably pneumonia. Normal heart size and
mediastinal contours. No acute osseous finding.
IMPRESSION: Right basilar opacity consistent with bronchopneumonia in this
setting. Followup PA and lateral chest X-ray is recommended in 3-4
weeks following trial of antibiotic therapy to ensure resolution.

## 2018-04-18 ENCOUNTER — Encounter
